# Patient Record
Sex: Female | Born: 1960 | Race: White | Hispanic: No | Marital: Single | Smoking: Never smoker
Health system: Southern US, Community
[De-identification: ages and names within clinical notes are randomized; demographics above are authoritative.]

## PROBLEM LIST (undated history)

## (undated) HISTORY — PX: TUBAL LIGATION: SHX77

## (undated) HISTORY — PX: OTHER SURGICAL HISTORY: SHX169

---

## 2019-05-16 ENCOUNTER — Emergency Department (HOSPITAL_BASED_OUTPATIENT_CLINIC_OR_DEPARTMENT_OTHER)
Admission: EM | Admit: 2019-05-16 | Discharge: 2019-05-17 | Disposition: A | Discharge status: Home / Self Care | Payer: No Typology Code available for payment source | Attending: Emergency Medicine | Admitting: Emergency Medicine

## 2019-05-16 ENCOUNTER — Other Ambulatory Visit: Payer: Self-pay

## 2019-05-16 ENCOUNTER — Encounter (HOSPITAL_BASED_OUTPATIENT_CLINIC_OR_DEPARTMENT_OTHER): Payer: Self-pay | Admitting: Emergency Medicine

## 2019-05-16 DIAGNOSIS — S8991XA Unspecified injury of right lower leg, initial encounter: Secondary | ICD-10-CM | POA: Diagnosis present

## 2019-05-16 DIAGNOSIS — Y9389 Activity, other specified: Secondary | ICD-10-CM | POA: Insufficient documentation

## 2019-05-16 DIAGNOSIS — W19XXXA Unspecified fall, initial encounter: Secondary | ICD-10-CM

## 2019-05-16 DIAGNOSIS — Y990 Civilian activity done for income or pay: Secondary | ICD-10-CM | POA: Insufficient documentation

## 2019-05-16 DIAGNOSIS — S60221A Contusion of right hand, initial encounter: Secondary | ICD-10-CM

## 2019-05-16 DIAGNOSIS — S8002XA Contusion of left knee, initial encounter: Secondary | ICD-10-CM

## 2019-05-16 DIAGNOSIS — Y9289 Other specified places as the place of occurrence of the external cause: Secondary | ICD-10-CM | POA: Diagnosis not present

## 2019-05-16 DIAGNOSIS — S8001XA Contusion of right knee, initial encounter: Secondary | ICD-10-CM

## 2019-05-16 DIAGNOSIS — S0083XA Contusion of other part of head, initial encounter: Secondary | ICD-10-CM

## 2019-05-16 DIAGNOSIS — W01198A Fall on same level from slipping, tripping and stumbling with subsequent striking against other object, initial encounter: Secondary | ICD-10-CM | POA: Insufficient documentation

## 2019-05-16 NOTE — ED Triage Notes (Signed)
Pt states she was at work and was walking and fell at the door in the med room  Pt is c/o bilateral knee pain, right palm, and right buttock pain  Pt was sent for evaluation

## 2019-05-17 ENCOUNTER — Emergency Department (HOSPITAL_BASED_OUTPATIENT_CLINIC_OR_DEPARTMENT_OTHER): Payer: 59 | Attending: Emergency Medicine

## 2019-05-17 MED ORDER — NAPROXEN 250 MG PO TABS
500.0000 mg | ORAL_TABLET | Freq: Once | ORAL | Status: AC
  Administered 2019-05-17: 500 mg via ORAL
  Filled 2019-05-17: qty 2

## 2019-05-17 MED ORDER — NAPROXEN 375 MG PO TABS: ORAL_TABLET | ORAL | 0 refills | Status: AC

## 2019-05-17 NOTE — ED Provider Notes (Signed)
[facility] Provider Note: Georgena Spurling, MD, FACEP  CSN: [id number] MRN: [id number] ARRIVAL: 05/16/19 at 2110 ROOM: [place]  [facility]   HISTORY OF PRESENT ILLNESS  05/17/19 1:32 AM Michelle Fitzpatrick is a 59 y.o. female who fell at work about 7:45 PM yesterday evening.  She felt like her shoe got stuck to the floor causing her to fall.  She fell forward onto her knees, her right hand and her right cheek.  She is having mild pain in her right cheek and left patella, moderate pain in her right hand and right patella.  She has ecchymoses of her right hand patellas bilaterally.  She denies neck pain.  Tetanus immunization is up-to-date.   History reviewed. No pertinent past medical history.  Past Surgical History:  Procedure Laterality Date  . arthrocopic shoulder surgery     . TUBAL LIGATION      Family History  Adopted: Yes    Social History   Tobacco Use  . Smoking status: Never Smoker  . Smokeless tobacco: Never Used  Substance Use Topics  . Alcohol use: Never  . Drug use: Never    Prior to Admission medications   Not on File    Allergies Codeine   REVIEW OF SYSTEMS  Negative except as noted here or in the History of Present Illness.   PHYSICAL EXAMINATION  Initial Vital Signs Blood pressure (!) 160/89, pulse (!) 54, temperature 97.9 F (36.6 C), temperature source Oral, resp. rate 17, height 5\' 6"  (1.676 m), weight 69.4 kg, SpO2 100 %.  Examination General: Well-developed, well-nourished female in no acute distress; appearance consistent with age of record HENT: normocephalic; no palpable or visible hematoma of face; no hemotympanum Eyes: pupils equal, round and reactive to light; extraocular muscles intact Neck: supple; nontender Heart: regular rate and rhythm Lungs: clear to auscultation bilaterally Abdomen: soft; nondistended; nontender; bowel sounds present Extremities: No deformity; full range of motion; pulses normal;  ecchymosis and mild tenderness of right hand and left patella, moderate to severe tenderness of right patella, patient able to extend and flex both knees without difficulty:      Neurologic: Awake, alert and oriented; motor function intact in all extremities and symmetric; no facial droop Skin: Warm and dry Psychiatric: Normal mood and affect   RESULTS  Summary of this visit's results, reviewed and interpreted by myself:   EKG Interpretation  Date/Time:    Ventricular Rate:    PR Interval:    QRS Duration:   QT Interval:    QTC Calculation:   R Axis:     Text Interpretation:        Laboratory Studies: No results found for this or any previous visit (from the past 24 hour(s)). Imaging Studies: DG Knee 4 Views W/Patella Right  Result Date: 05/17/2019 CLINICAL DATA:  Status post fall. EXAM: RIGHT KNEE - COMPLETE 4+ VIEW COMPARISON:  None. FINDINGS: No evidence of fracture, dislocation, or joint effusion. No evidence of arthropathy. A 4 mm bone island is seen within the lateral epicondyle of the distal right femur. Mild soft tissue swelling is seen along the anteromedial aspect of the right patella. IMPRESSION: Mild anteromedial soft tissue swelling without evidence of acute fracture or dislocation. Electronically Signed   By: Virgina Norfolk M.D.   On: 05/17/2019 02:25   DG Hand Complete Right  Result Date: 05/17/2019 CLINICAL DATA:  Status post fall. EXAM: RIGHT HAND - COMPLETE 3+ VIEW COMPARISON:  None. FINDINGS: There is  no evidence of fracture or dislocation. There is no evidence of arthropathy or other focal bone abnormality. Soft tissues are unremarkable. IMPRESSION: Negative. Electronically Signed   By: Aram Candela M.D.   On: 05/17/2019 02:23    ED COURSE and MDM  Nursing notes, initial and subsequent vitals signs, including pulse oximetry, reviewed and interpreted by myself.  Vitals:   05/16/19 2127 05/16/19 2132 05/16/19 2331 05/17/19 0222  BP:  133/84 (!)  160/89 135/86  Pulse:  (!) 56 (!) 54 (!) 52  Resp:  18 17 16   Temp:  98.1 F (36.7 C) 97.9 F (36.6 C)   TempSrc:  Oral Oral   SpO2:  97% 100% 100%  Weight: 69.4 kg     Height: 5\' 6"  (1.676 m)      Medications  naproxen (NAPROSYN) tablet 500 mg (has no administration in time range)    No evidence of fracture on radiographs.  We will place patient's right knee in knee sleeve.  PROCEDURES  Procedures   ED DIAGNOSES     ICD-10-CM   1. Fall  W19. DG Knee 4 Views W/Patella Right    DG Knee 4 Views W/Patella Right  2. Contusion of right hand, initial encounter  S60.221A   3. Contusion of right knee, initial encounter  S80.01XA   4. Contusion of left knee, initial encounter  S80.02XA   5. Contusion of face, initial encounter  S00.83XA        Henri Guedes, 07-18-1973, MD 05/17/19 [phone]

## 2021-11-17 IMAGING — DX DG HAND COMPLETE 3+V*R*
3 series · 3 of 3 positions shown · non-contrast
Comparison: None.

CLINICAL DATA: Status post fall.

EXAM:
RIGHT HAND - COMPLETE 3+ VIEW

[hand pa]
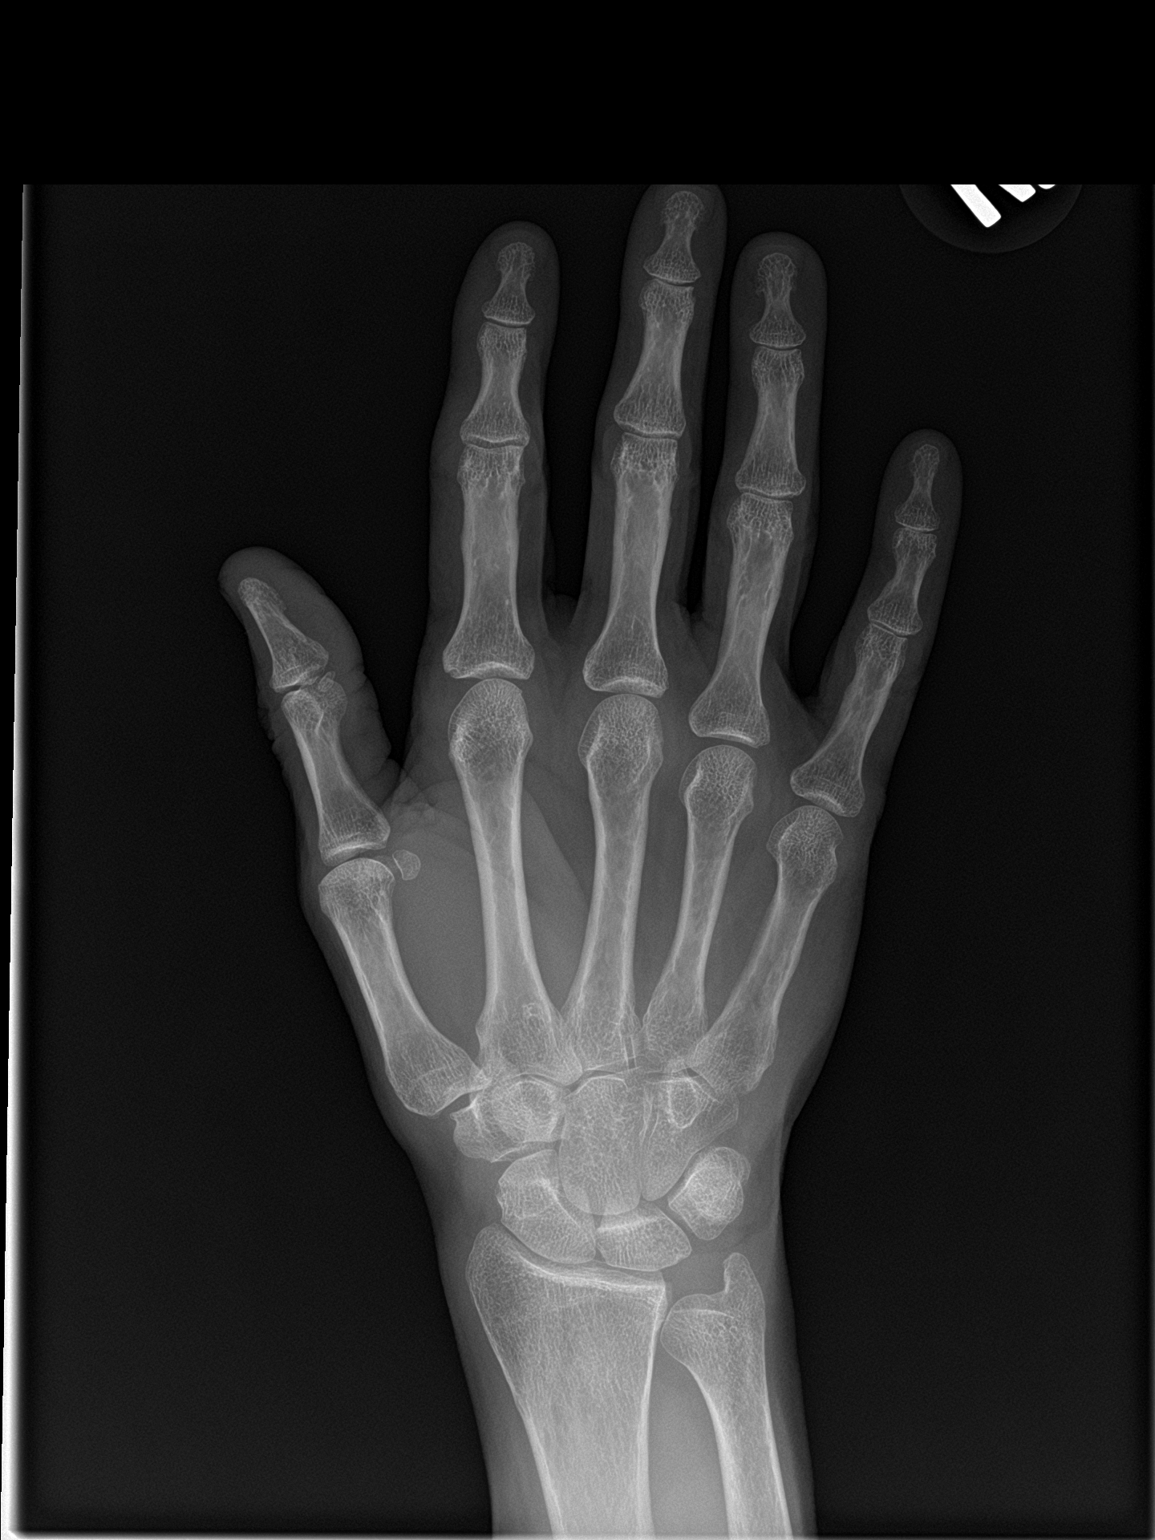

[hand obl]
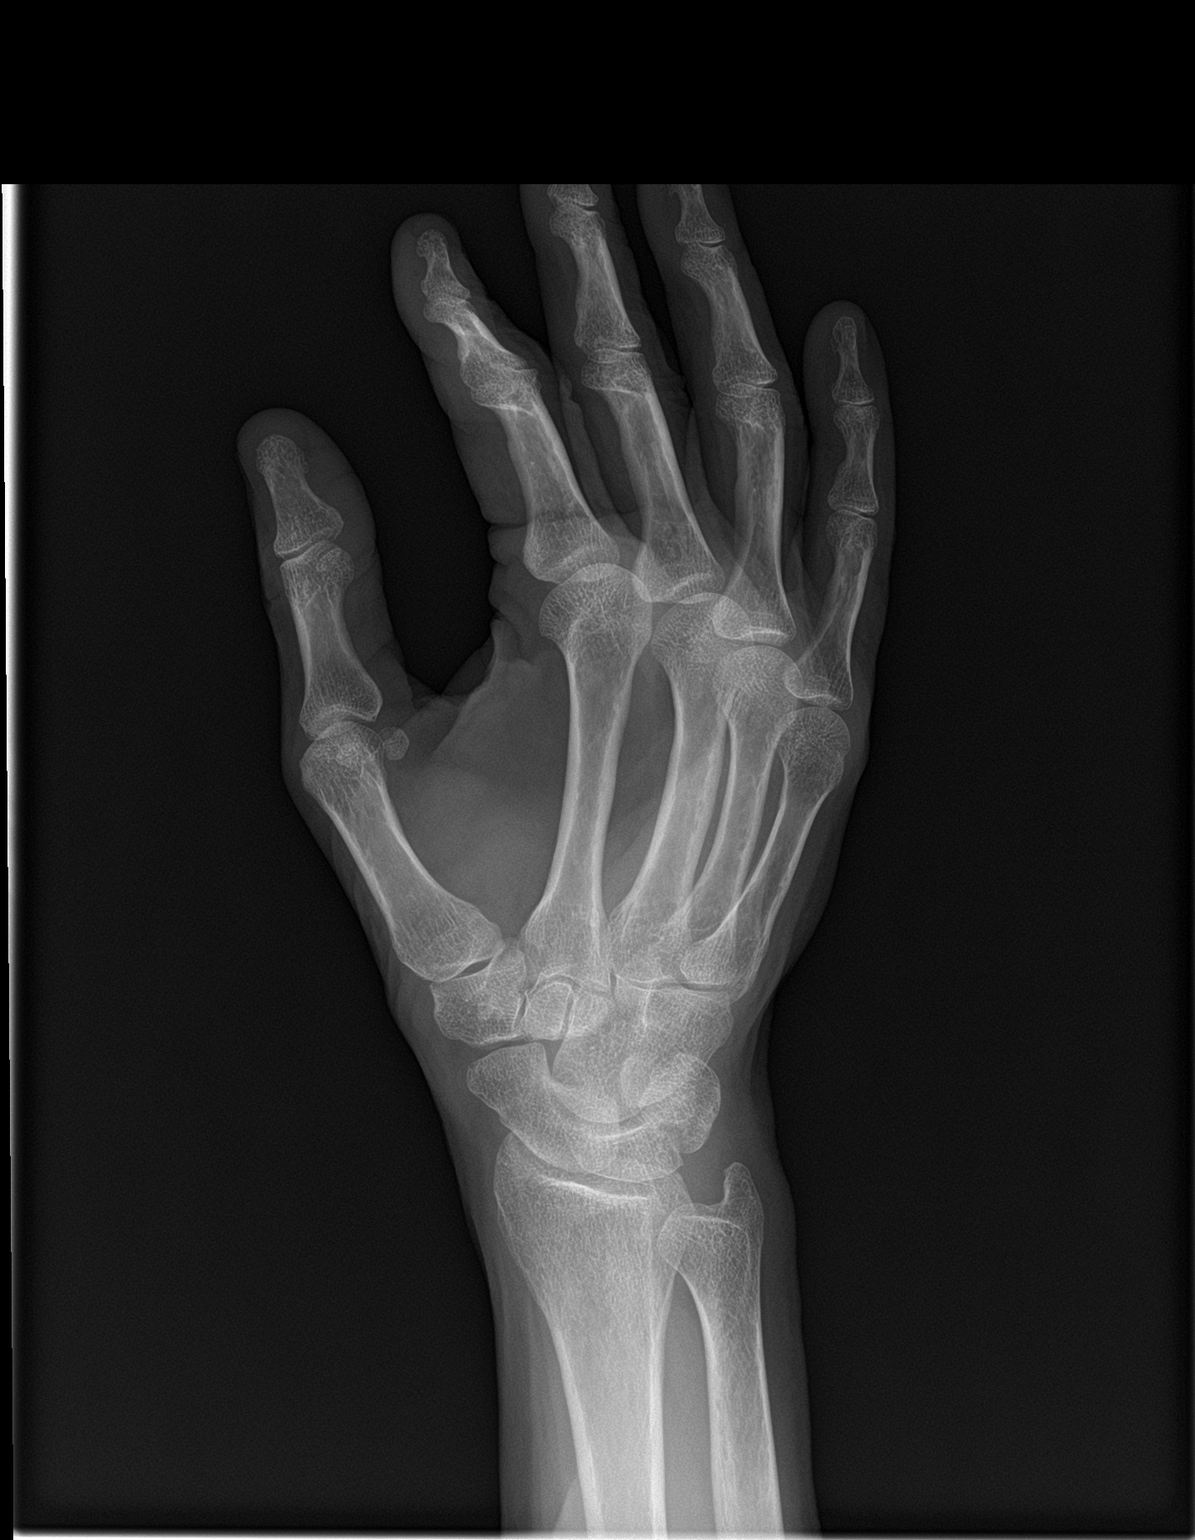

[hand lat]
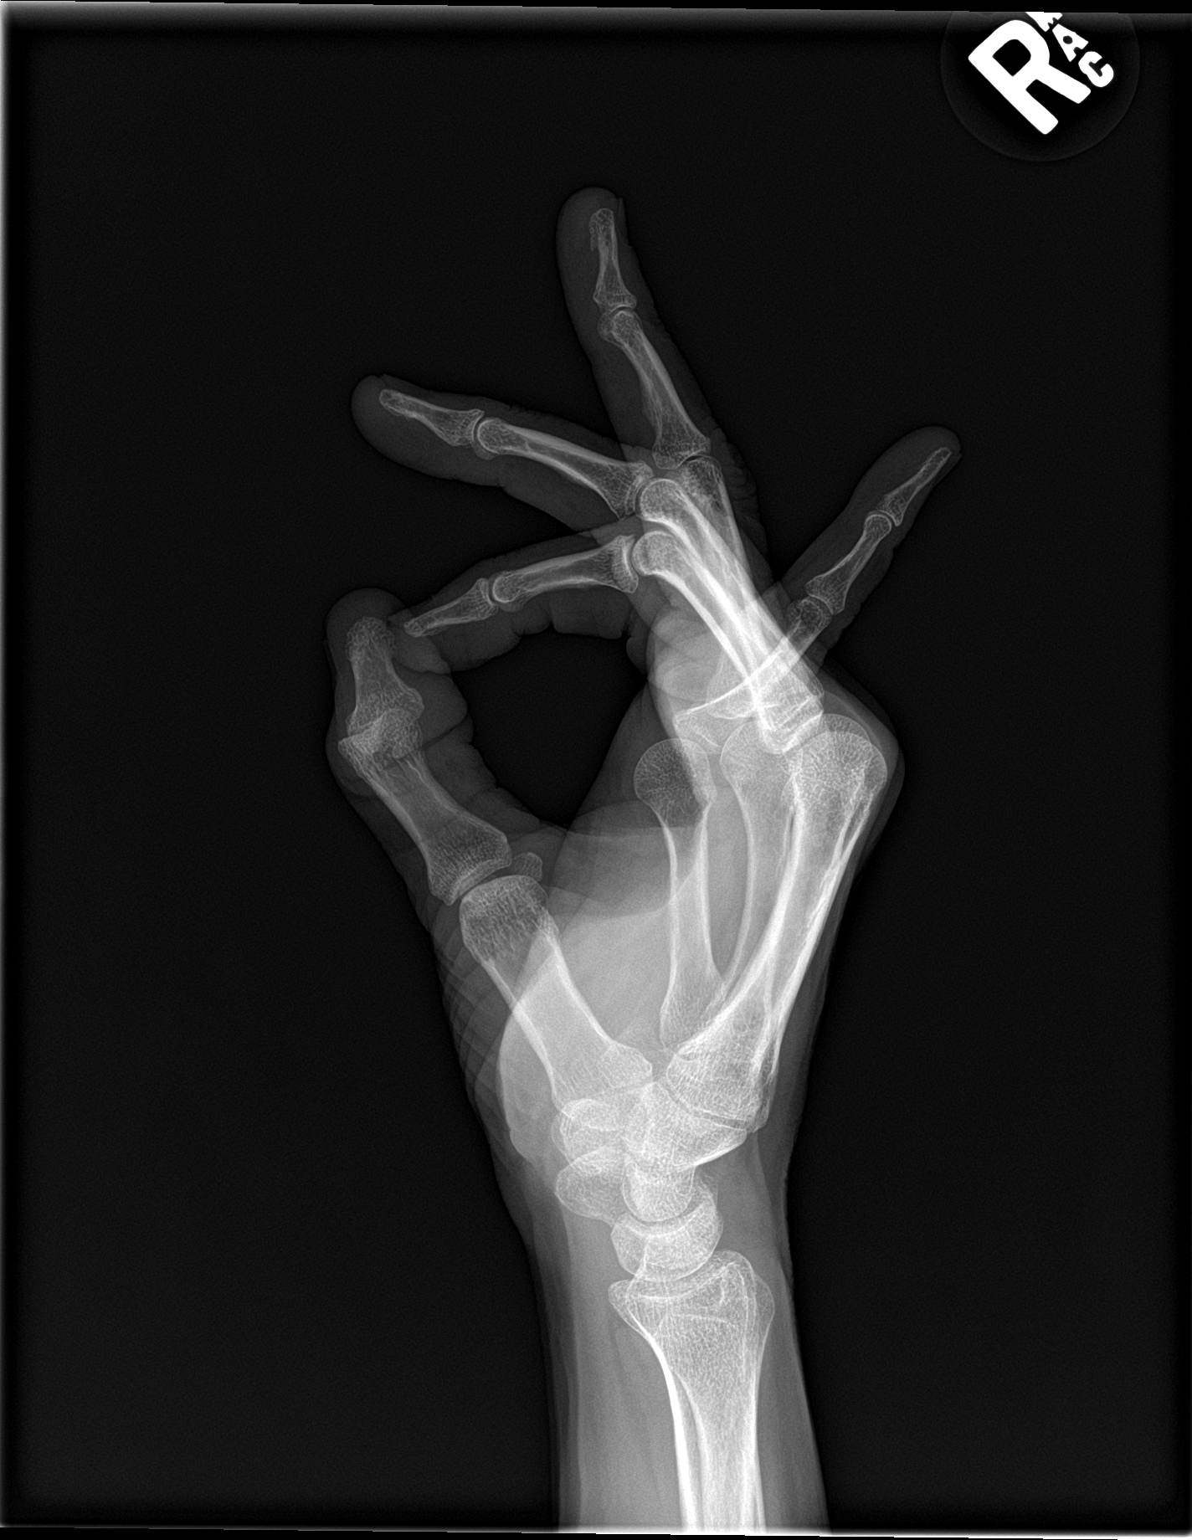

[3 of 3 positions shown; findings below may reference images not displayed]

FINDINGS: There is no evidence of fracture or dislocation. There is no
evidence of arthropathy or other focal bone abnormality. Soft
tissues are unremarkable.
IMPRESSION: Negative.
